# Patient Record
Sex: Female | Born: 1965 | Race: White | Hispanic: No | Marital: Single | Smoking: Never smoker
Health system: Southern US, Community
[De-identification: ages and names within clinical notes are randomized; demographics above are authoritative.]

---

## 1992-05-29 HISTORY — PX: REDUCTION MAMMAPLASTY: SUR839

## 2004-05-29 HISTORY — PX: ABDOMINAL HYSTERECTOMY: SHX81

## 2005-05-08 ENCOUNTER — Inpatient Hospital Stay: Payer: Self-pay | Admitting: Obstetrics and Gynecology

## 2005-05-31 ENCOUNTER — Ambulatory Visit: Payer: Self-pay | Admitting: Obstetrics and Gynecology

## 2005-06-07 ENCOUNTER — Ambulatory Visit: Payer: Self-pay | Admitting: Obstetrics and Gynecology

## 2005-12-14 ENCOUNTER — Ambulatory Visit: Payer: Self-pay | Admitting: Obstetrics and Gynecology

## 2006-06-05 ENCOUNTER — Ambulatory Visit: Payer: Self-pay | Admitting: Obstetrics and Gynecology

## 2008-07-02 ENCOUNTER — Ambulatory Visit: Payer: Self-pay | Admitting: Obstetrics and Gynecology

## 2009-07-20 ENCOUNTER — Ambulatory Visit: Payer: Self-pay

## 2009-10-19 ENCOUNTER — Ambulatory Visit: Payer: Self-pay | Admitting: Internal Medicine

## 2010-07-21 ENCOUNTER — Ambulatory Visit: Payer: Self-pay

## 2011-02-03 ENCOUNTER — Ambulatory Visit: Payer: Self-pay

## 2011-06-28 ENCOUNTER — Ambulatory Visit: Payer: Self-pay

## 2011-07-25 ENCOUNTER — Ambulatory Visit: Payer: Self-pay

## 2012-07-31 ENCOUNTER — Ambulatory Visit: Payer: Self-pay

## 2012-08-19 ENCOUNTER — Ambulatory Visit: Payer: Self-pay

## 2014-08-12 ENCOUNTER — Ambulatory Visit: Payer: Self-pay

## 2015-07-23 ENCOUNTER — Other Ambulatory Visit: Payer: Self-pay | Admitting: Obstetrics and Gynecology

## 2015-07-23 DIAGNOSIS — Z1231 Encounter for screening mammogram for malignant neoplasm of breast: Secondary | ICD-10-CM

## 2015-08-26 ENCOUNTER — Ambulatory Visit
Admission: RE | Admit: 2015-08-26 | Discharge: 2015-08-26 | Disposition: A | Discharge status: Home / Self Care | Payer: 59 | Source: Ambulatory Visit | Attending: Obstetrics and Gynecology | Admitting: Obstetrics and Gynecology

## 2015-08-26 DIAGNOSIS — Z1231 Encounter for screening mammogram for malignant neoplasm of breast: Secondary | ICD-10-CM | POA: Insufficient documentation

## 2017-05-25 ENCOUNTER — Other Ambulatory Visit: Payer: Self-pay | Admitting: Obstetrics and Gynecology

## 2017-06-15 ENCOUNTER — Other Ambulatory Visit: Payer: Self-pay | Admitting: Certified Nurse Midwife

## 2017-06-15 DIAGNOSIS — Z1231 Encounter for screening mammogram for malignant neoplasm of breast: Secondary | ICD-10-CM

## 2017-06-26 ENCOUNTER — Ambulatory Visit: Payer: 59

## 2017-07-02 ENCOUNTER — Ambulatory Visit
Admission: RE | Admit: 2017-07-02 | Discharge: 2017-07-02 | Disposition: A | Discharge status: Home / Self Care | Payer: 59 | Source: Ambulatory Visit | Attending: Certified Nurse Midwife | Admitting: Certified Nurse Midwife

## 2017-07-02 DIAGNOSIS — Z1231 Encounter for screening mammogram for malignant neoplasm of breast: Secondary | ICD-10-CM | POA: Diagnosis not present

## 2018-05-27 ENCOUNTER — Other Ambulatory Visit: Payer: Self-pay | Admitting: Obstetrics and Gynecology

## 2018-05-27 DIAGNOSIS — Z1231 Encounter for screening mammogram for malignant neoplasm of breast: Secondary | ICD-10-CM

## 2018-07-03 ENCOUNTER — Encounter (INDEPENDENT_AMBULATORY_CARE_PROVIDER_SITE_OTHER): Payer: Self-pay

## 2018-07-03 ENCOUNTER — Ambulatory Visit
Admission: RE | Admit: 2018-07-03 | Discharge: 2018-07-03 | Disposition: A | Discharge status: Home / Self Care | Payer: 59 | Source: Ambulatory Visit | Attending: Obstetrics and Gynecology | Admitting: Obstetrics and Gynecology

## 2018-07-03 DIAGNOSIS — Z1231 Encounter for screening mammogram for malignant neoplasm of breast: Secondary | ICD-10-CM | POA: Diagnosis not present

## 2019-06-05 ENCOUNTER — Other Ambulatory Visit: Payer: Self-pay | Admitting: Obstetrics & Gynecology

## 2019-06-05 DIAGNOSIS — Z1231 Encounter for screening mammogram for malignant neoplasm of breast: Secondary | ICD-10-CM

## 2019-06-27 LAB — COLOGUARD: COLOGUARD: NEGATIVE

## 2019-07-07 ENCOUNTER — Other Ambulatory Visit: Payer: Self-pay

## 2019-07-07 ENCOUNTER — Ambulatory Visit
Admission: RE | Admit: 2019-07-07 | Discharge: 2019-07-07 | Disposition: A | Discharge status: Home / Self Care | Payer: 59 | Source: Ambulatory Visit | Attending: Obstetrics & Gynecology | Admitting: Obstetrics & Gynecology

## 2019-07-07 DIAGNOSIS — Z1231 Encounter for screening mammogram for malignant neoplasm of breast: Secondary | ICD-10-CM | POA: Insufficient documentation

## 2020-01-04 IMAGING — MG DIGITAL SCREENING BILATERAL MAMMOGRAM WITH TOMO AND CAD
8 series · 8 of 24 positions shown · non-contrast
Comparison: Previous exam(s).

CLINICAL DATA: Screening.

EXAM:
DIGITAL SCREENING BILATERAL MAMMOGRAM WITH TOMO AND CAD

[R CC synth-2D]
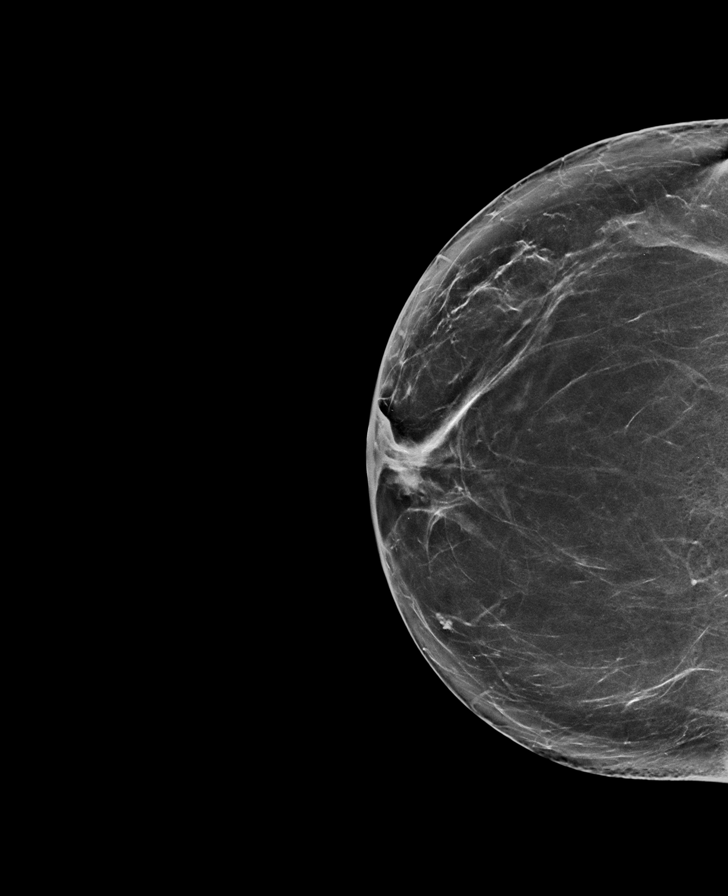

[L CC synth-2D]
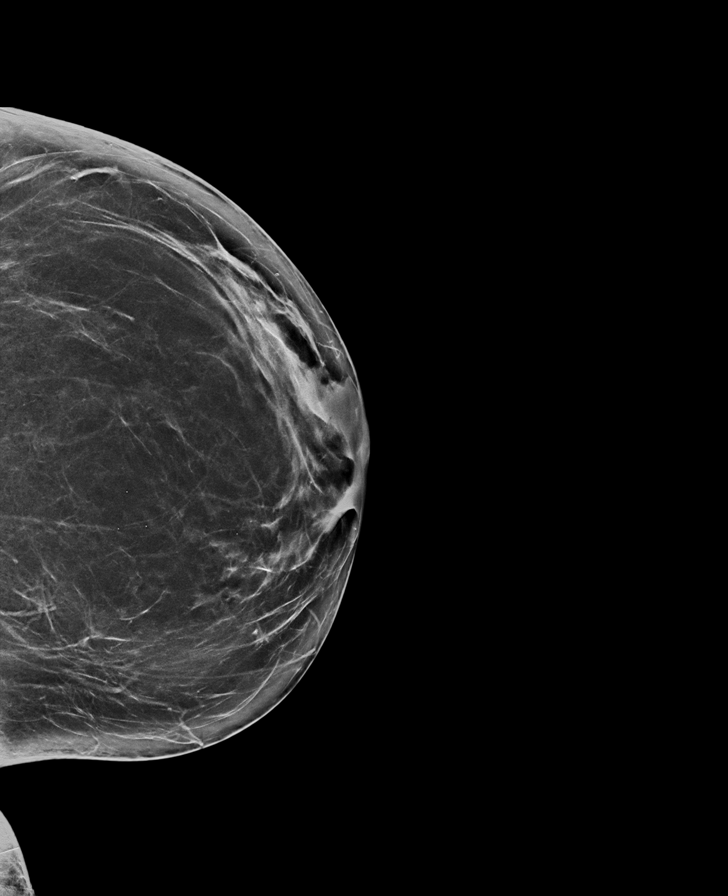

[R MLO synth-2D]
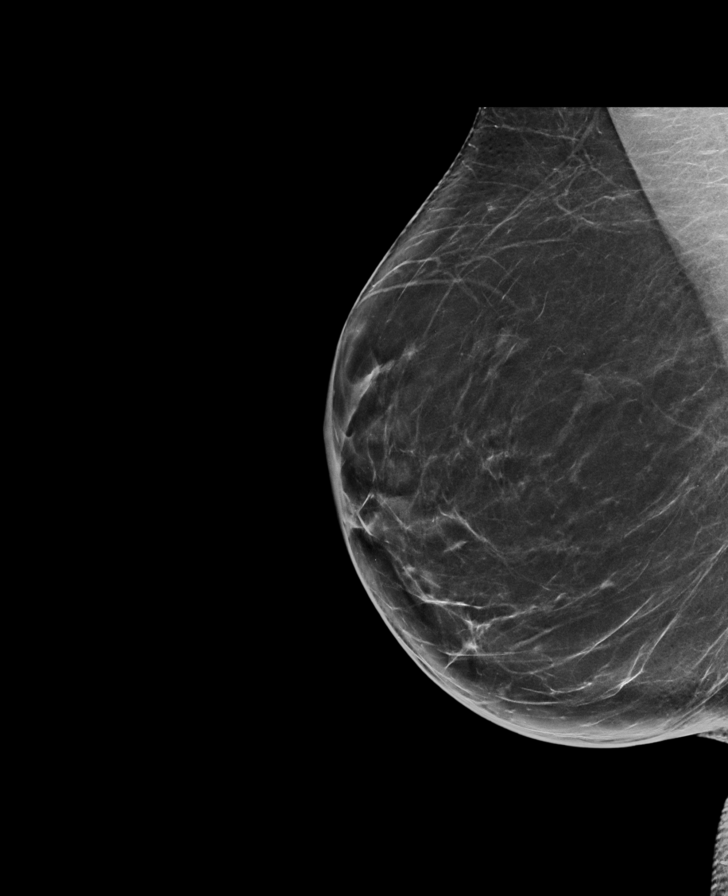

[L MLO synth-2D]
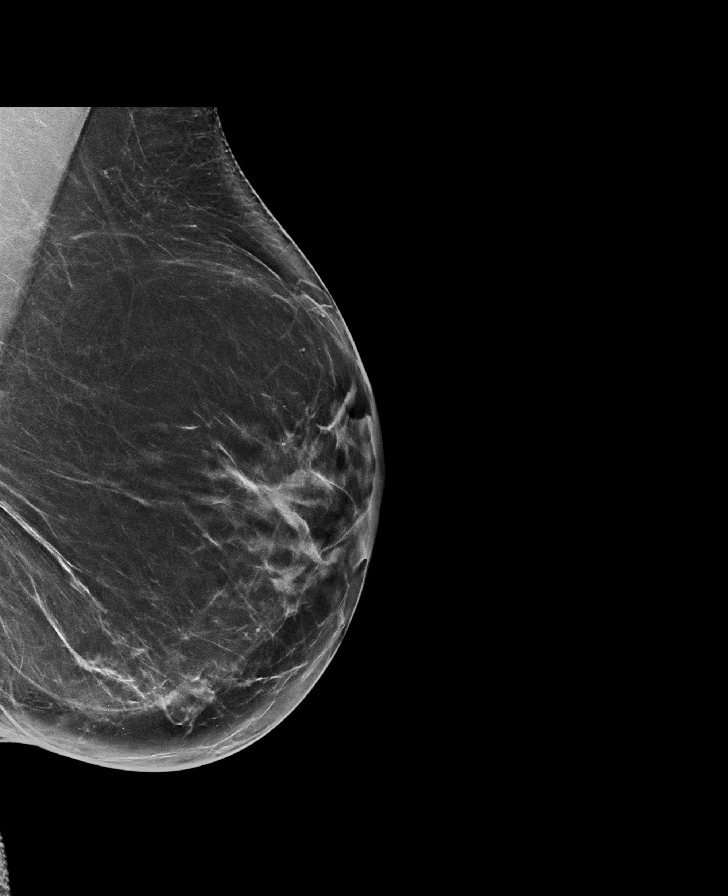

[L CC tomo · tomo slice 40/79.0]
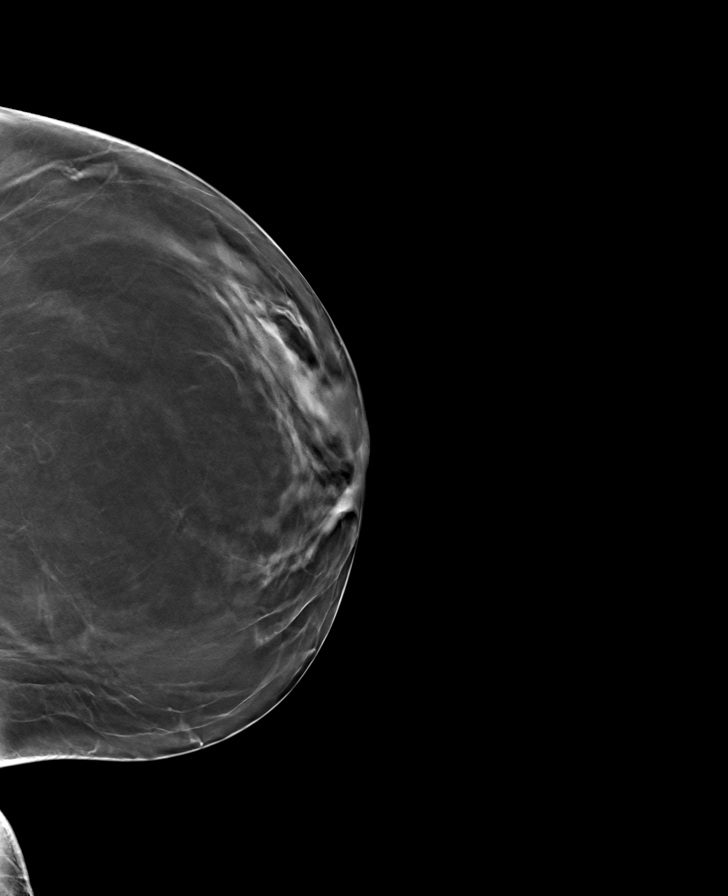

[L MLO tomo · tomo slice 42/83.0]
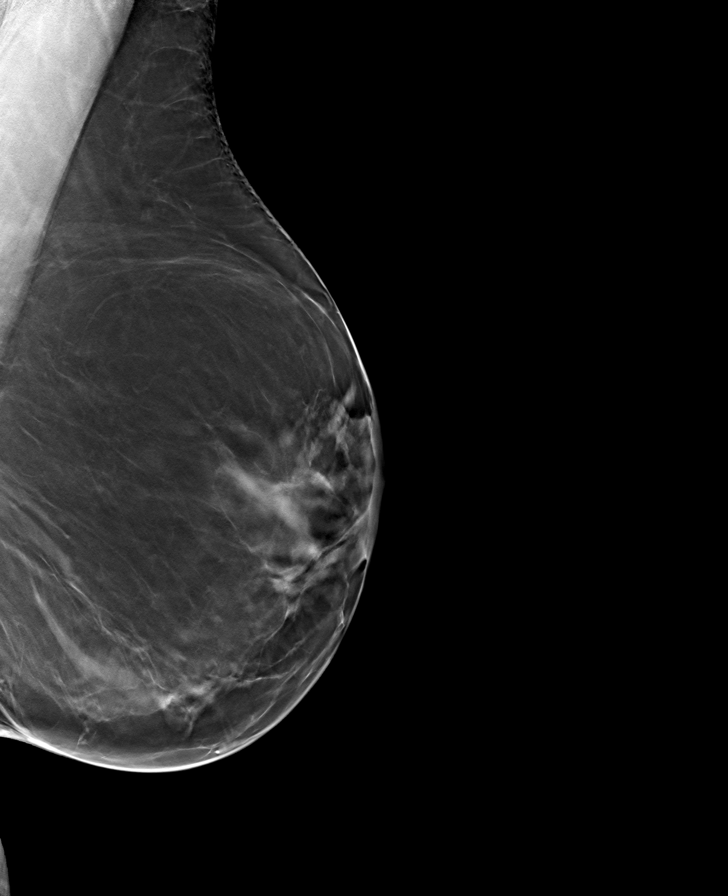

[R CC tomo · tomo slice 40/79.0]
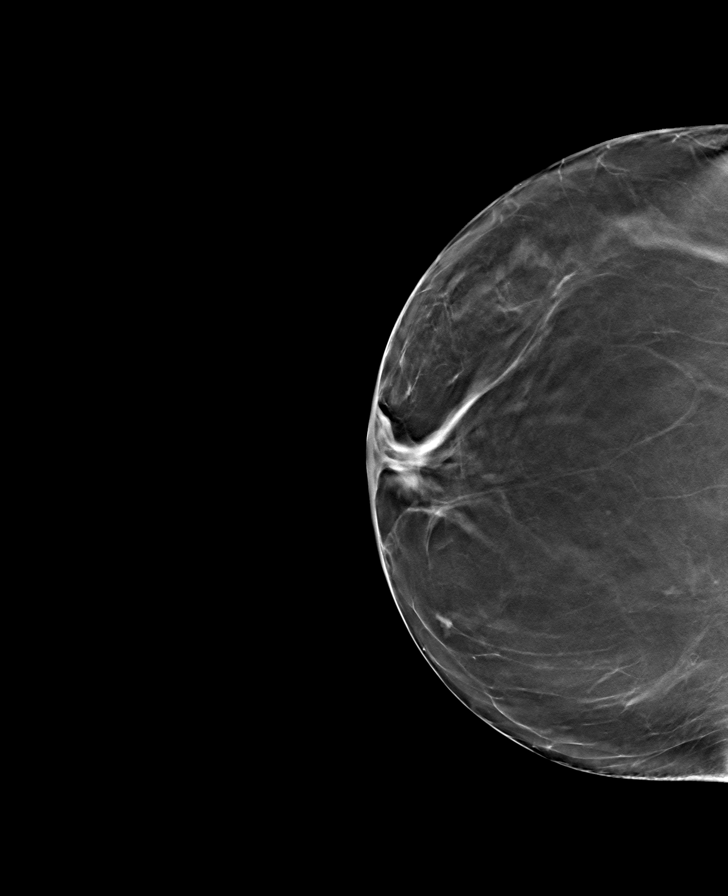

[R MLO tomo · tomo slice 39/78.0]
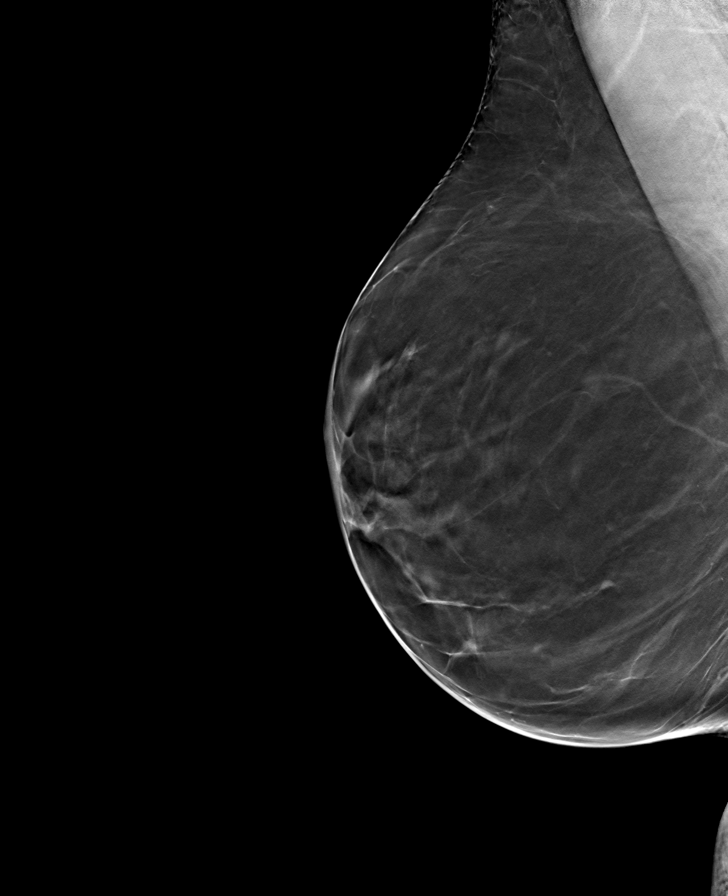

[8 of 24 positions shown; findings below may reference images not displayed]

ACR Breast Density Category b: There are scattered areas of
fibroglandular density.
FINDINGS: There are no findings suspicious for malignancy. Images were
processed with CAD.
IMPRESSION: No mammographic evidence of malignancy. A result letter of this
screening mammogram will be mailed directly to the patient.

RECOMMENDATION:
Screening mammogram in one year. (Code:[id number])

BI-RADS CATEGORY  1: Negative.

## 2020-06-24 ENCOUNTER — Other Ambulatory Visit: Payer: Self-pay | Admitting: Obstetrics & Gynecology

## 2020-06-24 DIAGNOSIS — Z1231 Encounter for screening mammogram for malignant neoplasm of breast: Secondary | ICD-10-CM

## 2020-07-08 ENCOUNTER — Ambulatory Visit
Admission: RE | Admit: 2020-07-08 | Discharge: 2020-07-08 | Disposition: A | Discharge status: Home / Self Care | Payer: 59 | Source: Ambulatory Visit | Attending: Obstetrics & Gynecology | Admitting: Obstetrics & Gynecology

## 2020-07-08 ENCOUNTER — Other Ambulatory Visit: Payer: Self-pay

## 2020-07-08 DIAGNOSIS — Z1231 Encounter for screening mammogram for malignant neoplasm of breast: Secondary | ICD-10-CM | POA: Diagnosis present

## 2021-06-28 ENCOUNTER — Other Ambulatory Visit: Payer: Self-pay | Admitting: Certified Nurse Midwife

## 2021-06-28 DIAGNOSIS — Z1231 Encounter for screening mammogram for malignant neoplasm of breast: Secondary | ICD-10-CM

## 2021-08-16 ENCOUNTER — Ambulatory Visit
Admission: RE | Admit: 2021-08-16 | Discharge: 2021-08-16 | Disposition: A | Discharge status: Home / Self Care | Payer: Managed Care, Other (non HMO) | Source: Ambulatory Visit | Attending: Certified Nurse Midwife | Admitting: Certified Nurse Midwife

## 2021-08-16 ENCOUNTER — Other Ambulatory Visit: Payer: Self-pay

## 2021-08-16 DIAGNOSIS — Z1231 Encounter for screening mammogram for malignant neoplasm of breast: Secondary | ICD-10-CM | POA: Diagnosis present

## 2022-05-17 ENCOUNTER — Encounter: Payer: Self-pay | Admitting: Emergency Medicine

## 2022-05-17 ENCOUNTER — Ambulatory Visit
Admission: EM | Admit: 2022-05-17 | Discharge: 2022-05-17 | Disposition: A | Discharge status: Home / Self Care | Payer: Managed Care, Other (non HMO) | Attending: Emergency Medicine | Admitting: Emergency Medicine

## 2022-05-17 DIAGNOSIS — R030 Elevated blood-pressure reading, without diagnosis of hypertension: Secondary | ICD-10-CM | POA: Diagnosis present

## 2022-05-17 DIAGNOSIS — U071 COVID-19: Secondary | ICD-10-CM | POA: Diagnosis present

## 2022-05-17 LAB — RESP PANEL BY RT-PCR (RSV, FLU A&B, COVID)  RVPGX2
Influenza A by PCR: NEGATIVE
Influenza B by PCR: NEGATIVE
Resp Syncytial Virus by PCR: NEGATIVE
SARS Coronavirus 2 by RT PCR: POSITIVE — AB

## 2022-05-17 LAB — GROUP A STREP BY PCR: Group A Strep by PCR: NOT DETECTED

## 2022-05-17 MED ORDER — IBUPROFEN 600 MG PO TABS: 600.0000 mg | ORAL_TABLET | Freq: Four times a day (QID) | ORAL | 0 refills | Status: AC | PRN

## 2022-05-17 NOTE — ED Provider Notes (Signed)
HPI  SUBJECTIVE:  Patient reports sore throat starting yesterday.  She recently returned from a business trip.  Patient has been taking tea with honey with improvement in her symptoms.  Patient had a positive home COVID test and shows me a picture of this.  She would like to confirm COVID infection. No fever   No neck stiffness  No Cough, wheezing No nasal congestion, rhinorrhea, postnasal drip No Myalgias No Headache No Rash  No loss of taste or smell No shortness of breath or difficulty breathing No nausea, vomiting No diarrhea No abdominal pain     No Recent Strep,  flu, COVID exposure Got 4 doses of the COVID-vaccine and this years flu vaccine No reflux sxs No Allergy sxs  No Breathing difficulty, voice changes, sensation of throat swelling shut No Drooling No Trismus No abx in past month.  No antipyretic in past 6 hrs  Patient has a past medical history of hypercholesterolemia, and is status post tonsillectomy.  She has never had COVID.   History reviewed. No pertinent past medical history.  Past Surgical History:  Procedure Laterality Date   ABDOMINAL HYSTERECTOMY  2006   REDUCTION MAMMAPLASTY Bilateral 1994    Family History  Problem Relation Age of Onset   Breast cancer Neg Hx     Social History   Tobacco Use   Smoking status: Never   Smokeless tobacco: Never  Vaping Use   Vaping Use: Never used  Substance Use Topics   Alcohol use: Yes    Comment: social   Drug use: Never    No current facility-administered medications for this encounter.  Current Outpatient Medications:    ibuprofen (ADVIL) 600 MG tablet, Take 1 tablet (600 mg total) by mouth every 6 (six) hours as needed., Disp: 30 tablet, Rfl: 0   atorvastatin (LIPITOR) 10 MG tablet, Take 10 mg by mouth daily., Disp: , Rfl:    Biotin 1000 MCG tablet, Take by mouth., Disp: , Rfl:   Allergies  Allergen Reactions   Mixed Vespid Venom Other (See Comments)    Carpenter bee     ROS  As  noted in HPI.   Physical Exam  BP (!) 166/91 (BP Location: Left Arm)   Pulse 95   Temp 98.9 F (37.2 C) (Oral)   Resp 16   SpO2 97%   Constitutional: Well developed, well nourished, no acute distress Eyes:  EOMI, conjunctiva normal bilaterally HENT: Normocephalic, atraumatic,mucus membranes moist.  Mild nasal congestion, slightly erythematous oropharynx, tonsils absent, uvula midline Respiratory: Normal inspiratory effort, lungs clear bilaterally Cardiovascular: Normal rate, no murmurs, rubs, gallops GI: nondistended, nontender. No appreciable splenomegaly skin: No rash, skin intact Lymph: No anterior cervical LN.  No posterior cervical lymphadenopathy Musculoskeletal: no deformities Neurologic: Alert & oriented x 3, no focal neuro deficits Psychiatric: Speech and behavior appropriate.   ED Course   Medications - No data to display  Orders Placed This Encounter  Procedures   Group A Strep by PCR    Standing Status:   Standing    Number of Occurrences:   1   Resp panel by RT-PCR (RSV, Flu A&B, Covid) Anterior Nasal Swab    Standing Status:   Standing    Number of Occurrences:   1   Airborne and Contact precautions    Standing Status:   Standing    Number of Occurrences:   1    Results for orders placed or performed during the hospital encounter of 05/17/22 (from the past 24  hour(s))  Group A Strep by PCR     Status: None   Collection Time: 05/17/22 11:36 AM   Specimen: Throat; Sterile Swab  Result Value Ref Range   Group A Strep by PCR NOT DETECTED NOT DETECTED  Resp panel by RT-PCR (RSV, Flu A&B, Covid) Throat     Status: Abnormal   Collection Time: 05/17/22 11:36 AM   Specimen: Throat; Nasal Swab  Result Value Ref Range   SARS Coronavirus 2 by RT PCR POSITIVE (A) NEGATIVE   Influenza A by PCR NEGATIVE NEGATIVE   Influenza B by PCR NEGATIVE NEGATIVE   Resp Syncytial Virus by PCR NEGATIVE NEGATIVE   No results found.  ED Clinical Impression  1. COVID-19  virus infection   2. Elevated blood pressure reading without diagnosis of hypertension      ED Assessment/Plan    COVID positive.  Strep, influenza, RSV negative. Home with ibuprofen, Tylenol, Benadryl/Maalox mixture. Patient to followup with PCP when necessary.  We discussed antivirals, but patient is not at any particular risk for progression to severe illness.  Patient declined prescription of antivirals at this time.  Supportive treatment.   Blood pressure noted.  Patient states it is always high when she sees a provider.  She will keep an eye on this.  Discussed labs,  MDM, plan and followup with patient. Discussed sn/sx that should prompt return to the ED. patient agrees with plan.   Meds ordered this encounter  Medications   ibuprofen (ADVIL) 600 MG tablet    Sig: Take 1 tablet (600 mg total) by mouth every 6 (six) hours as needed.    Dispense:  30 tablet    Refill:  0     *This clinic note was created using Scientist, clinical (histocompatibility and immunogenetics). Therefore, there may be occasional mistakes despite careful proofreading.     Domenick Gong, MD 05/18/22 [phone]

## 2022-05-17 NOTE — Discharge Instructions (Signed)
You need to quarantine for 5 days since symptom onset.  Today's day #1.   1 gram of Tylenol and 600 mg ibuprofen together 3-4 times a day as needed for pain.  Make sure you drink plenty of extra fluids.  Some people find salt water gargles and  Traditional Medic[employer]'s "Throat Coat" tea helpful. Take 5 mL of liquid Benadryl and 5 mL of Maalox. Mix it together, and then hold it in your mouth for as long as you can and then swallow. You may do this 4 times a day.  Honey and lemon dissolved in hot water can also be soothing.  Continue being as active as you can.  Go to www.goodrx.com  or www.costplusdrugs.com to look up your medications. This will give you a list of where you can find your prescriptions at the most affordable prices. Or ask the pharmacist what the cash price is, or if they have any other discount programs available to help make your medication more affordable. This can be less expensive than what you would pay with insurance.

## 2022-05-17 NOTE — ED Triage Notes (Signed)
Pt presents with a sore throat since yesterday. Pt took an at home covid test and it was positive but she wants to be sure due to the test being old.

## 2022-08-01 ENCOUNTER — Other Ambulatory Visit: Payer: Self-pay | Admitting: Family Medicine

## 2022-08-01 DIAGNOSIS — Z1231 Encounter for screening mammogram for malignant neoplasm of breast: Secondary | ICD-10-CM

## 2022-08-13 LAB — EXTERNAL GENERIC LAB PROCEDURE: COLOGUARD: NEGATIVE

## 2022-08-13 LAB — COLOGUARD: COLOGUARD: NEGATIVE

## 2022-08-23 ENCOUNTER — Ambulatory Visit
Admission: RE | Admit: 2022-08-23 | Discharge: 2022-08-23 | Disposition: A | Discharge status: Home / Self Care | Payer: Managed Care, Other (non HMO) | Source: Ambulatory Visit | Attending: Family Medicine | Admitting: Family Medicine

## 2022-08-23 DIAGNOSIS — Z1231 Encounter for screening mammogram for malignant neoplasm of breast: Secondary | ICD-10-CM | POA: Diagnosis present

## 2022-08-24 ENCOUNTER — Other Ambulatory Visit: Payer: Self-pay | Admitting: Family Medicine

## 2022-08-24 ENCOUNTER — Ambulatory Visit: Payer: Managed Care, Other (non HMO)

## 2022-08-24 DIAGNOSIS — Z1231 Encounter for screening mammogram for malignant neoplasm of breast: Secondary | ICD-10-CM

## 2023-02-27 ENCOUNTER — Other Ambulatory Visit: Payer: Self-pay | Admitting: Family Medicine

## 2023-02-27 DIAGNOSIS — Z1231 Encounter for screening mammogram for malignant neoplasm of breast: Secondary | ICD-10-CM

## 2023-08-30 ENCOUNTER — Ambulatory Visit
Admission: RE | Admit: 2023-08-30 | Discharge: 2023-08-30 | Disposition: A | Discharge status: Home / Self Care | Payer: Managed Care, Other (non HMO) | Source: Ambulatory Visit | Attending: Family Medicine | Admitting: Family Medicine

## 2023-08-30 DIAGNOSIS — Z1231 Encounter for screening mammogram for malignant neoplasm of breast: Secondary | ICD-10-CM | POA: Insufficient documentation

## 2024-05-26 ENCOUNTER — Other Ambulatory Visit: Payer: Self-pay | Admitting: Family Medicine

## 2024-05-26 DIAGNOSIS — Z1231 Encounter for screening mammogram for malignant neoplasm of breast: Secondary | ICD-10-CM

## 2024-09-11 ENCOUNTER — Ambulatory Visit
